# Patient Record
Sex: Male | Born: 1995 | Race: Black or African American | Hispanic: No | Marital: Single
Health system: Southern US, Community
[De-identification: ages and names within clinical notes are randomized; demographics above are authoritative.]

---

## 2020-01-25 ENCOUNTER — Emergency Department (HOSPITAL_COMMUNITY)
Admission: EM | Admit: 2020-01-25 | Discharge: 2020-01-25 | Disposition: A | Discharge status: Home / Self Care | Payer: Managed Care, Other (non HMO) | Attending: Emergency Medicine | Admitting: Emergency Medicine

## 2020-01-25 ENCOUNTER — Encounter (HOSPITAL_COMMUNITY): Payer: Self-pay

## 2020-01-25 ENCOUNTER — Emergency Department (HOSPITAL_COMMUNITY): Payer: Managed Care, Other (non HMO)

## 2020-01-25 ENCOUNTER — Other Ambulatory Visit: Payer: Self-pay

## 2020-01-25 ENCOUNTER — Emergency Department (HOSPITAL_COMMUNITY)
Admission: EM | Admit: 2020-01-25 | Discharge: 2020-01-25 | Discharge status: Absent without leave | Payer: Managed Care, Other (non HMO)

## 2020-01-25 DIAGNOSIS — Z5321 Procedure and treatment not carried out due to patient leaving prior to being seen by health care provider: Secondary | ICD-10-CM | POA: Insufficient documentation

## 2020-01-25 DIAGNOSIS — R1012 Left upper quadrant pain: Secondary | ICD-10-CM | POA: Insufficient documentation

## 2020-01-25 MED ORDER — ONDANSETRON 4 MG PO TBDP
4.0000 mg | ORAL_TABLET | Freq: Once | ORAL | Status: AC | PRN
  Administered 2020-01-25: 4 mg via ORAL
  Filled 2020-01-25: qty 1

## 2020-01-25 NOTE — ED Triage Notes (Signed)
Patient arrived with complaints LUQ pain that radiates to his chest that started this morning, reports this is a reoccurring issue but has been unable to get diagnosed with anything. One episode of vomiting.

## 2021-09-22 IMAGING — CR DG CHEST 2V
2 series · 2 of 2 positions shown · non-contrast
Comparison: None.

CLINICAL DATA: Chest pain

EXAM:
CHEST - 2 VIEW

[w chest pa]
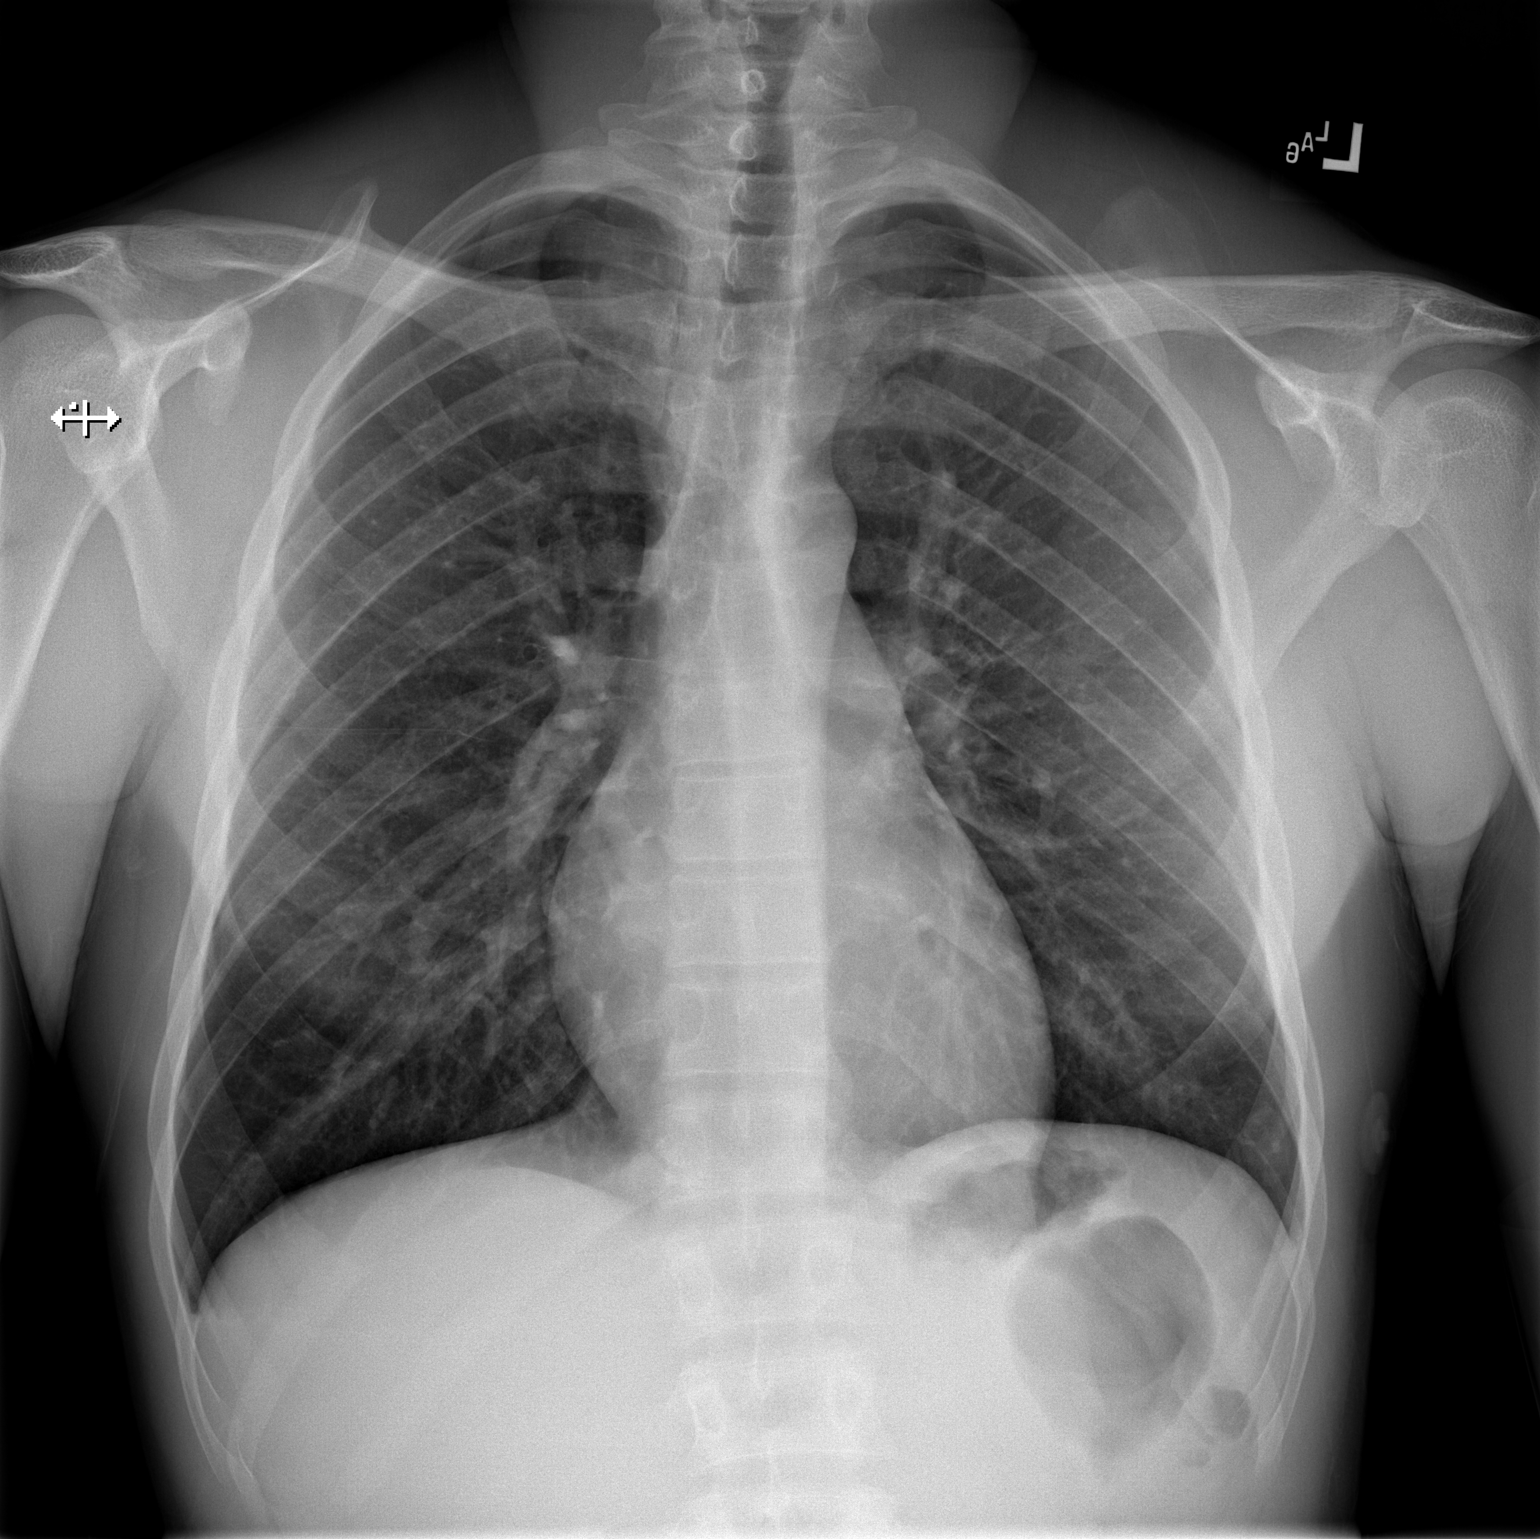

[w chest lat]
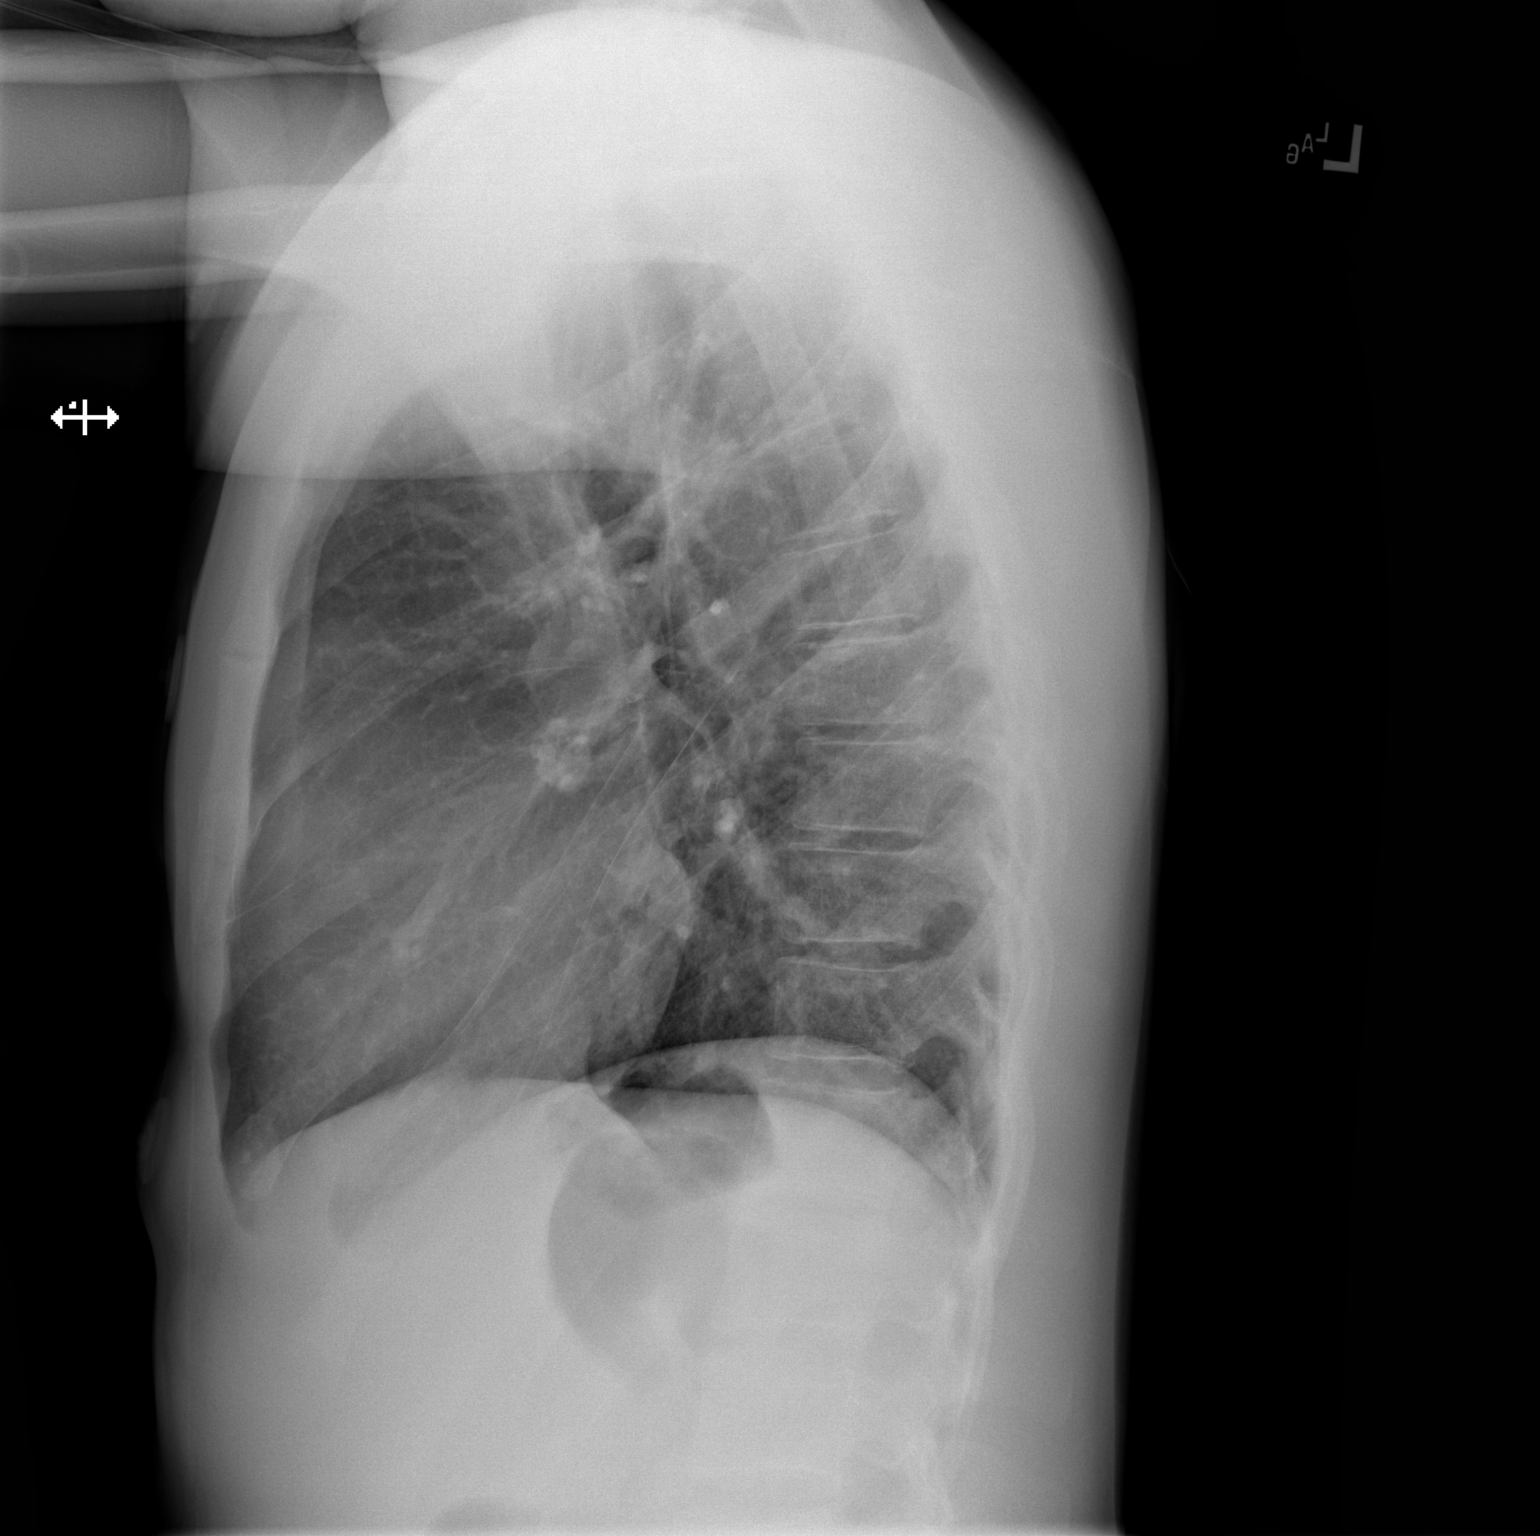

[2 of 2 positions shown; findings below may reference images not displayed]

FINDINGS: The heart size and mediastinal contours are within normal limits.
Both lungs are clear. The visualized skeletal structures are
unremarkable.
IMPRESSION: No active cardiopulmonary disease.
